# Patient Record
Sex: Male | Born: 1985
Health system: Southern US, Community
[De-identification: ages and names within clinical notes are randomized; demographics above are authoritative.]

## PROBLEM LIST (undated history)

## (undated) DIAGNOSIS — J45909 Unspecified asthma, uncomplicated: Secondary | ICD-10-CM

## (undated) DIAGNOSIS — T7840XA Allergy, unspecified, initial encounter: Secondary | ICD-10-CM

## (undated) DIAGNOSIS — E3452 Partial androgen insensitivity syndrome: Secondary | ICD-10-CM

## (undated) HISTORY — PX: OTHER SURGICAL HISTORY: SHX169

## (undated) HISTORY — DX: Partial androgen insensitivity syndrome: E34.52

## (undated) HISTORY — DX: Allergy, unspecified, initial encounter: T78.40XA

---
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
## 2008-08-31 ENCOUNTER — Emergency Department: Payer: Self-pay | Admitting: Internal Medicine

## 2014-07-08 ENCOUNTER — Emergency Department (HOSPITAL_COMMUNITY)
Admission: EM | Admit: 2014-07-08 | Discharge: 2014-07-08 | Disposition: A | Discharge status: Home / Self Care | Payer: PRIVATE HEALTH INSURANCE | Attending: Emergency Medicine | Admitting: Emergency Medicine

## 2014-07-08 ENCOUNTER — Encounter (HOSPITAL_COMMUNITY): Payer: Self-pay | Admitting: Emergency Medicine

## 2014-07-08 ENCOUNTER — Emergency Department (HOSPITAL_COMMUNITY): Payer: PRIVATE HEALTH INSURANCE

## 2014-07-08 DIAGNOSIS — S6992XA Unspecified injury of left wrist, hand and finger(s), initial encounter: Secondary | ICD-10-CM | POA: Diagnosis present

## 2014-07-08 DIAGNOSIS — Y281XXA Contact with knife, undetermined intent, initial encounter: Secondary | ICD-10-CM | POA: Insufficient documentation

## 2014-07-08 DIAGNOSIS — S61412A Laceration without foreign body of left hand, initial encounter: Secondary | ICD-10-CM | POA: Diagnosis not present

## 2014-07-08 DIAGNOSIS — Y990 Civilian activity done for income or pay: Secondary | ICD-10-CM | POA: Diagnosis not present

## 2014-07-08 DIAGNOSIS — IMO0002 Reserved for concepts with insufficient information to code with codable children: Secondary | ICD-10-CM

## 2014-07-08 DIAGNOSIS — Y9389 Activity, other specified: Secondary | ICD-10-CM | POA: Diagnosis not present

## 2014-07-08 DIAGNOSIS — J45909 Unspecified asthma, uncomplicated: Secondary | ICD-10-CM | POA: Insufficient documentation

## 2014-07-08 DIAGNOSIS — Y929 Unspecified place or not applicable: Secondary | ICD-10-CM | POA: Insufficient documentation

## 2014-07-08 HISTORY — DX: Unspecified asthma, uncomplicated: J45.909

## 2014-07-08 MED ORDER — LIDOCAINE HCL (PF) 1 % IJ SOLN
5.0000 mL | Freq: Once | INTRAMUSCULAR | Status: AC
Start: 2014-07-08 — End: 2014-07-08
  Administered 2014-07-08: 5 mL
  Filled 2014-07-08: qty 5

## 2014-07-08 NOTE — ED Provider Notes (Signed)
CSN: [id number]     Arrival date & time 07/08/14  [id number] History   First MD Initiated Contact with Patient 07/08/14 [phone]     Chief Complaint  Patient presents with  . Laceration     (Consider location/radiation/quality/duration/timing/severity/associated sxs/prior Treatment) HPI Comments: Joseph Mcguire is a 29 y.o. male with a PMHx of asthma, who presents to the ED with complaints of left hand laceration that occurred at 6:40 AM. He states he was working with a metal knife and accidentally punctured the dorsum of his left hand, creating a 1 cm straight laceration. He states he is having 3/10 constant stinging pain to this area, nonradiating, with no known aggravating or alleviating factors given the has not tried anything prior to arrival. Bleeding has been controlled with pressure. Last tetanus was 6 months ago. Denies any numbness, tingling, weakness, dizziness, lightheadedness, chest pain, shortness breath, abdominal pain, nausea, or vomiting. Denies any bleeding conditions or blood thinners. Denies any immunocompromising medications or conditions.  Patient is a 29 y.o. male presenting with skin laceration. The history is provided by the patient. No language interpreter was used.  Laceration Location:  Hand Hand laceration location:  Dorsum of L hand Length (cm):  1 Depth:  Cutaneous Quality: straight   Bleeding: controlled with pressure   Time since incident:  30 minutes Laceration mechanism:  Knife Pain details:    Quality: stinging.   Severity:  Mild   Timing:  Constant   Progression:  Unchanged Foreign body present:  No foreign bodies Relieved by:  None tried Worsened by:  Nothing tried Ineffective treatments:  None tried Tetanus status:  Up to date   Past Medical History  Diagnosis Date  . Asthma    History reviewed. No pertinent past surgical history. No family history on file. History  Substance Use Topics  . Smoking status: Current Every Day Smoker  .  Smokeless tobacco: Not on file  . Alcohol Use: Yes    Review of Systems  Respiratory: Negative for shortness of breath.   Cardiovascular: Negative for chest pain.  Gastrointestinal: Negative for nausea, vomiting and abdominal pain.  Musculoskeletal: Positive for myalgias (L hand). Negative for arthralgias.  Skin: Positive for wound.  Allergic/Immunologic: Negative for immunocompromised state.  Neurological: Negative for dizziness, syncope and light-headedness.  Hematological: Does not bruise/bleed easily.   10 Systems reviewed and are negative for acute change except as noted in the HPI.    Allergies  Review of patient's allergies indicates no known allergies.  Home Medications   Prior to Admission medications   Not on File   BP 128/73 mmHg  Pulse 62  Temp(Src) 98 F (36.7 C) (Oral)  Resp 14  Ht 6' (1.829 m)  Wt 190 lb (86.183 kg)  BMI 25.76 kg/m2  SpO2 99% Physical Exam  Constitutional: He is oriented to person, place, and time. Vital signs are normal. He appears well-developed and well-nourished.  Non-toxic appearance. No distress.  Afebrile, nontoxic, NAD  HENT:  Head: Normocephalic and atraumatic.  Mouth/Throat: Mucous membranes are normal.  Eyes: Conjunctivae and EOM are normal. Right eye exhibits no discharge. Left eye exhibits no discharge.  Neck: Normal range of motion. Neck supple.  Cardiovascular: Normal rate and intact distal pulses.   Pulmonary/Chest: Effort normal. No respiratory distress.  Abdominal: Normal appearance. He exhibits no distension.  Musculoskeletal: Normal range of motion.       Left hand: He exhibits laceration. He exhibits normal range of motion, no tenderness, no bony tenderness, normal  two-point discrimination, normal capillary refill and no swelling. Normal sensation noted. Normal strength noted.       Hands: L hand with ~1cm linear laceration to dorsum of hand, in the webspace between 1st and 2nd digits, extending into the musculature  without any tendon involvement. SEE PICTURE BELOW. Wiggles all digits, FROM intact at MCP/DIP/PIP joints of all digits, no bony TTP, no swelling or deformity, sensation and strength grossly intact. Cap refill brisk and present, distal pulses intact  Neurological: He is alert and oriented to person, place, and time. He has normal strength. No sensory deficit.  Skin: Skin is warm and dry. Laceration noted. No rash noted.  L hand laceration as described above. SEE PICTURE BELOW  Psychiatric: He has a normal mood and affect.  Nursing note and vitals reviewed.     ED Course  LACERATION REPAIR Date/Time: 07/08/2014 8:26 AM Performed by: Allen Derry Authorized by: Allen Derry Consent: Verbal consent obtained. Risks and benefits: risks, benefits and alternatives were discussed Consent given by: patient Patient understanding: patient states understanding of the procedure being performed Patient consent: the patient's understanding of the procedure matches consent given Patient identity confirmed: verbally with patient Body area: upper extremity Location details: left hand Laceration length: 1 cm Foreign bodies: no foreign bodies Tendon involvement: none Nerve involvement: none Vascular damage: no Anesthesia: local infiltration Local anesthetic: lidocaine 1% without epinephrine Anesthetic total: 1 ml Patient sedated: no Preparation: Patient was prepped and draped in the usual sterile fashion. Irrigation solution: saline Irrigation method: syringe Amount of cleaning: extensive Debridement: none Degree of undermining: none Skin closure: 4-0 Prolene Number of sutures: 1 Technique: simple Approximation: close Approximation difficulty: simple Dressing: 4x4 sterile gauze Patient tolerance: Patient tolerated the procedure well with no immediate complications   (including critical care time) Labs Review Labs Reviewed - No data to display  Imaging Review Dg  Hand Complete Left  07/08/2014   CLINICAL DATA:  Left hand laceration, evaluate for retained foreign body  EXAM: LEFT HAND - COMPLETE 3+ VIEW  COMPARISON:  None.  FINDINGS: Three views of the left hand submitted. No acute fracture or subluxation. No radiopaque foreign body is identified.  IMPRESSION: No acute fracture or subluxation. No radiopaque foreign body is identified.   Electronically Signed   By: Natasha Mead M.D.   On: 07/08/2014 07:57     EKG Interpretation None      MDM   Final diagnoses:  Laceration  Hand laceration, left, initial encounter    29 y.o. male here with small 1cm lac to dorsum of L hand, superficial but he states his knife punctured deeper therefore will obtain xray to r/o any retained FB. Neurovascularly intact with soft compartments, FROM intact at all digits. Will proceed with numbing area and probing, then suturing closed. Pt declines wanting pain meds at this time. Tetanus UTD. Will reassess shortly.   8:40 AM Xray negative. After numbing wound, probed, noted the depth to extend into musculature but did not involve any tendons, given that ROM intact, proceeded with copious irrigation and suture closure. Pt tolerating procedure well. Discussed ice/elevation/tylenol/motrin for pain. Doubt need for abx, discussed s/sx of infection to watch for as well as proper wound care, and will have him f/up with PCP or [facility] for suture removal in 7-10 days. I explained the diagnosis and have given explicit precautions to return to the ER including for any other new or worsening symptoms. The patient understands and accepts the medical plan as it's been dictated  and I have answered their questions. Discharge instructions concerning home care and prescriptions have been given. The patient is STABLE and is discharged to home in good condition.  BP 128/73 mmHg  Pulse 62  Temp(Src) 98 F (36.7 C) (Oral)  Resp 14  Ht 6' (1.829 m)  Wt 190 lb (86.183 kg)  BMI 25.76 kg/m2  SpO2  99%  Meds ordered this encounter  Medications  . lidocaine (PF) (XYLOCAINE) 1 % injection 5 mL    Sig:      Robertine Kipper Camprubi-Soms, PA-C 07/08/14 [id number]  Linwood DibblesJon Knapp, MD 07/08/14 [phone]

## 2014-07-08 NOTE — ED Notes (Signed)
Pt. presents with laceration approx. 1 inch at left posterior hand while at work this morning , pt. stated his knife accidentally slipped and hit his left hand. Bleeding controlled / dressing applied .

## 2014-07-08 NOTE — Discharge Instructions (Signed)
Keep wound clean with mild soap and water. Keep area covered with a topical antibiotic ointment and bandage, keep bandage dry, and do not submerge in water for 24 hours. Ice and elevate for additional pain relief and swelling. Alternate between Naprosyn and Tylenol for additional pain relief. Follow up with your primary care doctor or the [facility] in approximately 7-10 days for wound recheck and suture removal. Monitor area for signs of infection to include, but not limited to: increasing pain, redness, drainage/pus, or swelling. Return to emergency department for emergent changing or worsening symptoms.    Laceration Care, Adult A laceration is a cut that goes through all layers of the skin. The cut goes into the tissue beneath the skin. HOME CARE For stitches (sutures) or staples:  Keep the cut clean and dry.  If you have a bandage (dressing), change it at least once a day. Change the bandage if it gets wet or dirty, or as told by your doctor.  Wash the cut with soap and water 2 times a day. Rinse the cut with water. Pat it dry with a clean towel.  Put a thin layer of medicated cream on the cut as told by your doctor.  You may shower after the first 24 hours. Do not soak the cut in water until the stitches are removed.  Only take medicines as told by your doctor.  Have your stitches or staples removed as told by your doctor. For skin adhesive strips:  Keep the cut clean and dry.  Do not get the strips wet. You may take a bath, but be careful to keep the cut dry.  If the cut gets wet, pat it dry with a clean towel.  The strips will fall off on their own. Do not remove the strips that are still stuck to the cut. For wound glue:  You may shower or take baths. Do not soak or scrub the cut. Do not swim. Avoid heavy sweating until the glue falls off on its own. After a shower or bath, pat the cut dry with a clean towel.  Do not put medicine on your cut until the  glue falls off.  If you have a bandage, do not put tape over the glue.  Avoid lots of sunlight or tanning lamps until the glue falls off. Put sunscreen on the cut for the first year to reduce your scar.  The glue will fall off on its own. Do not pick at the glue. You may need a tetanus shot if:  You cannot remember when you had your last tetanus shot.  You have never had a tetanus shot. If you need a tetanus shot and you choose not to have one, you may get tetanus. Sickness from tetanus can be serious. GET HELP RIGHT AWAY IF:   Your pain does not get better with medicine.  Your arm, hand, leg, or foot loses feeling (numbness) or changes color.  Your cut is bleeding.  Your joint feels weak, or you cannot use your joint.  You have painful lumps on your body.  Your cut is red, puffy (swollen), or painful.  You have a red line on the skin near the cut.  You have yellowish-white fluid (pus) coming from the cut.  You have a fever.  You have a bad smell coming from the cut or bandage.  Your cut breaks open before or after stitches are removed.  You notice something coming out of the cut, such  as wood or glass.  You cannot move a finger or toe. MAKE SURE YOU:   Understand these instructions.  Will watch your condition.  Will get help right away if you are not doing well or get worse. Document Released: 07/27/2007 Document Revised: 05/02/2011 Document Reviewed: 08/03/2010 [facility] Patient Information 2015 [place], [place]. This information is not intended to replace advice given to you by your health care provider. Make sure you discuss any questions you have with your health care provider.  Stitches, Staples, or Skin Adhesive Strips  Stitches (sutures), staples, and skin adhesive strips hold the skin together as it heals. They will usually be in place for 7 days or less. HOME CARE  Wash your hands with soap and water before and after you touch your wound.  Only take  medicine as told by your doctor.  Cover your wound only if your doctor told you to. Otherwise, leave it open to air.  Do not get your stitches wet or dirty. If they get dirty, dab them gently with a clean washcloth. Wet the washcloth with soapy water. Do not rub. Pat them dry gently.  Do not put medicine or medicated cream on your stitches unless your doctor told you to.  Do not take out your own stitches or staples. Skin adhesive strips will fall off by themselves.  Do not pick at the wound. Picking can cause an infection.  Do not miss your follow-up appointment.  If you have problems or questions, call your doctor. GET HELP RIGHT AWAY IF:   You have a temperature by mouth above 102 F (38.9 C), not controlled by medicine.  You have chills.  You have redness or pain around your stitches.  There is puffiness (swelling) around your stitches.  You notice fluid (drainage) from your stitches.  There is a bad smell coming from your wound. MAKE SURE YOU:  Understand these instructions.  Will watch your condition.  Will get help if you are not doing well or get worse. Document Released: 12/05/2008 Document Revised: 05/02/2011 Document Reviewed: 12/05/2008 [facility] Patient Information 2015 [place], [place]. This information is not intended to replace advice given to you by your health care provider. Make sure you discuss any questions you have with your health care provider.  Wound Care Wound care helps prevent pain and infection.  You may need a tetanus shot if:  You cannot remember when you had your last tetanus shot.  You have never had a tetanus shot.  The injury broke your skin. If you need a tetanus shot and you choose not to have one, you may get tetanus. Sickness from tetanus can be serious. HOME CARE   Only take medicine as told by your doctor.  Clean the wound daily with mild soap and water.  Change any bandages (dressings) as told by your doctor.  Put  medicated cream and a bandage on the wound as told by your doctor.  Change the bandage if it gets wet, dirty, or starts to smell.  Take showers. Do not take baths, swim, or do anything that puts your wound under water.  Rest and raise (elevate) the wound until the pain and puffiness (swelling) are better.  Keep all doctor visits as told. GET HELP RIGHT AWAY IF:   Yellowish-white fluid (pus) comes from the wound.  Medicine does not lessen your pain.  There is a red streak going away from the wound.  You have a fever. MAKE SURE YOU:   Understand these instructions.  Will watch your condition.  Will get help right away if you are not doing well or get worse. Document Released: 11/17/2007 Document Revised: 05/02/2011 Document Reviewed: 06/13/2010 [facility] Patient Information 2015 [place]Care, [place]. This information is not intended to replace advice given to you by your health care provider. Make sure you discuss any questions you have with your health care provider.

## 2015-09-17 DIAGNOSIS — R002 Palpitations: Secondary | ICD-10-CM | POA: Diagnosis not present

## 2015-12-15 ENCOUNTER — Encounter (HOSPITAL_COMMUNITY): Payer: Self-pay | Admitting: Emergency Medicine

## 2015-12-15 ENCOUNTER — Ambulatory Visit (HOSPITAL_COMMUNITY)
Admission: EM | Admit: 2015-12-15 | Discharge: 2015-12-15 | Disposition: A | Discharge status: Home / Self Care | Payer: 59 | Attending: Family Medicine | Admitting: Family Medicine

## 2015-12-15 DIAGNOSIS — J029 Acute pharyngitis, unspecified: Secondary | ICD-10-CM | POA: Insufficient documentation

## 2015-12-15 LAB — POCT RAPID STREP A: STREPTOCOCCUS, GROUP A SCREEN (DIRECT): NEGATIVE

## 2015-12-15 NOTE — ED Triage Notes (Signed)
Chills, fever, hot and cold spells, sore throat.  Wife was recently sick and treated for strep.

## 2015-12-15 NOTE — ED Provider Notes (Signed)
CSN: [id number]     Arrival date & time 12/15/15  1544 History   First MD Initiated Contact with Patient 12/15/15 1624     Chief Complaint  Patient presents with  . Sore Throat   (Consider location/radiation/quality/duration/timing/severity/associated sxs/prior Treatment) 30 year old male states that approximately 48 hours ago he developed sore throat. This was followed by some achiness and fever. Earlier today his temperature was 100.3. Is currently 98.2 after he had taken ibuprofen. He thought he had seen some white spots on his left tonsil. He feels there is some discomfort to the left ear otherwise no new complaints. He has a history of allergies and generally takes medicines for it. St wife was given ABX for sore throat over 3 weeks ago.      Past Medical History:  Diagnosis Date  . Asthma    History reviewed. No pertinent surgical history. History reviewed. No pertinent family history. Social History  Substance Use Topics  . Smoking status: Former Games developer  . Smokeless tobacco: Never Used  . Alcohol use Yes    Review of Systems  Constitutional: Positive for activity change and fever. Negative for diaphoresis and fatigue.  HENT: Positive for postnasal drip and sore throat. Negative for ear pain, facial swelling and rhinorrhea.   Eyes: Negative for pain, discharge and redness.  Respiratory: Negative for cough, chest tightness and shortness of breath.   Cardiovascular: Negative.   Gastrointestinal: Negative.   Musculoskeletal: Negative.  Negative for neck pain and neck stiffness.  Skin: Negative for rash.  Neurological: Negative.   All other systems reviewed and are negative.   Allergies  Review of patient's allergies indicates no known allergies.  Home Medications   Prior to Admission medications   Medication Sig Start Date End Date Taking? Authorizing Provider  loratadine (CLARITIN) 10 MG tablet Take 10 mg by mouth daily.   Yes Historical Provider, MD   Meds  Ordered and Administered this Visit  Medications - No data to display  BP 129/71 (BP Location: Right Arm)   Pulse 93   Temp 98.2 F (36.8 C) (Oral)   Resp 18   SpO2 100%  No data found.   Physical Exam  Constitutional: He is oriented to person, place, and time. He appears well-developed and well-nourished. No distress.  HENT:  Head: Normocephalic and atraumatic.  Mouth/Throat: No oropharyngeal exudate.  Bilateral TMs with minor retraction. No effusion or erythema. Oropharynx with mildly erythematous small palatine tonsils palatine arch and posterior pharynx with erythema and cobblestoning.  Eyes: EOM are normal.  Neck: Normal range of motion. Neck supple.  Too small tender nodes, 1 each to the left and right side of the anterior cervical chain.  Cardiovascular: Normal rate and regular rhythm.   Pulmonary/Chest: Effort normal and breath sounds normal.  Musculoskeletal: Normal range of motion. He exhibits no edema.  Neurological: He is alert and oriented to person, place, and time.  Skin: Skin is warm and dry.  Psychiatric: He has a normal mood and affect.  Nursing note and vitals reviewed.   Urgent Care Course   Clinical Course    Procedures (including critical care time)  Labs Review Labs Reviewed  POCT RAPID STREP A   Results for orders placed or performed during the hospital encounter of 12/15/15  POCT rapid strep A [facility])  Result Value Ref Range   Streptococcus, Group A Screen (Direct) NEGATIVE NEGATIVE     Imaging Review No results found.   Visual Acuity Review  Right Eye  Distance:   Left Eye Distance:   Bilateral Distance:    Right Eye Near:   Left Eye Near:    Bilateral Near:         MDM   1. Pharyngitis, unspecified etiology    The strep test was negative. A culture will be performed as a backup test to make sure it is or is not streptococcus A. If it does come back positive we will call you and will be able to treat you over the  telephone. In the meantime treat symptomatically with lots of cool liquids and ibuprofen 600 mg every 6 hours. This will last just a few days. You also had the appearance of moderate amount of clear drainage in the back of the throat. Take an antihistamine daily, a continue the Claritin or take something a little stronger such as Allegra 180 mg for the drainage. T/C pending.    Hayden Rasmussenavid Carrick Rijos, NP 12/15/15 [phone]

## 2015-12-15 NOTE — Discharge Instructions (Signed)
The strep test was negative. A culture will be performed as a backup test to make sure it is or is not streptococcus A. If it does come back positive we will call you and will be able to treat you over the telephone. In the meantime treat symptomatically with lots of cool liquids and ibuprofen 600 mg every 6 hours. This will last just a few days. You also had the appearance of moderate amount of clear drainage in the back of the throat. Take an antihistamine daily, a continue the Claritin or take something a little stronger such as Allegra 180 mg for the drainage.

## 2015-12-18 LAB — CULTURE, GROUP A STREP (THRC)

## 2016-01-20 IMAGING — CR DG HAND COMPLETE 3+V*L*
3 series · 3 of 3 positions shown · non-contrast
Comparison: None.

CLINICAL DATA: Left hand laceration, evaluate for retained foreign
body

EXAM:
LEFT HAND - COMPLETE 3+ VIEW

[x hand pa left]
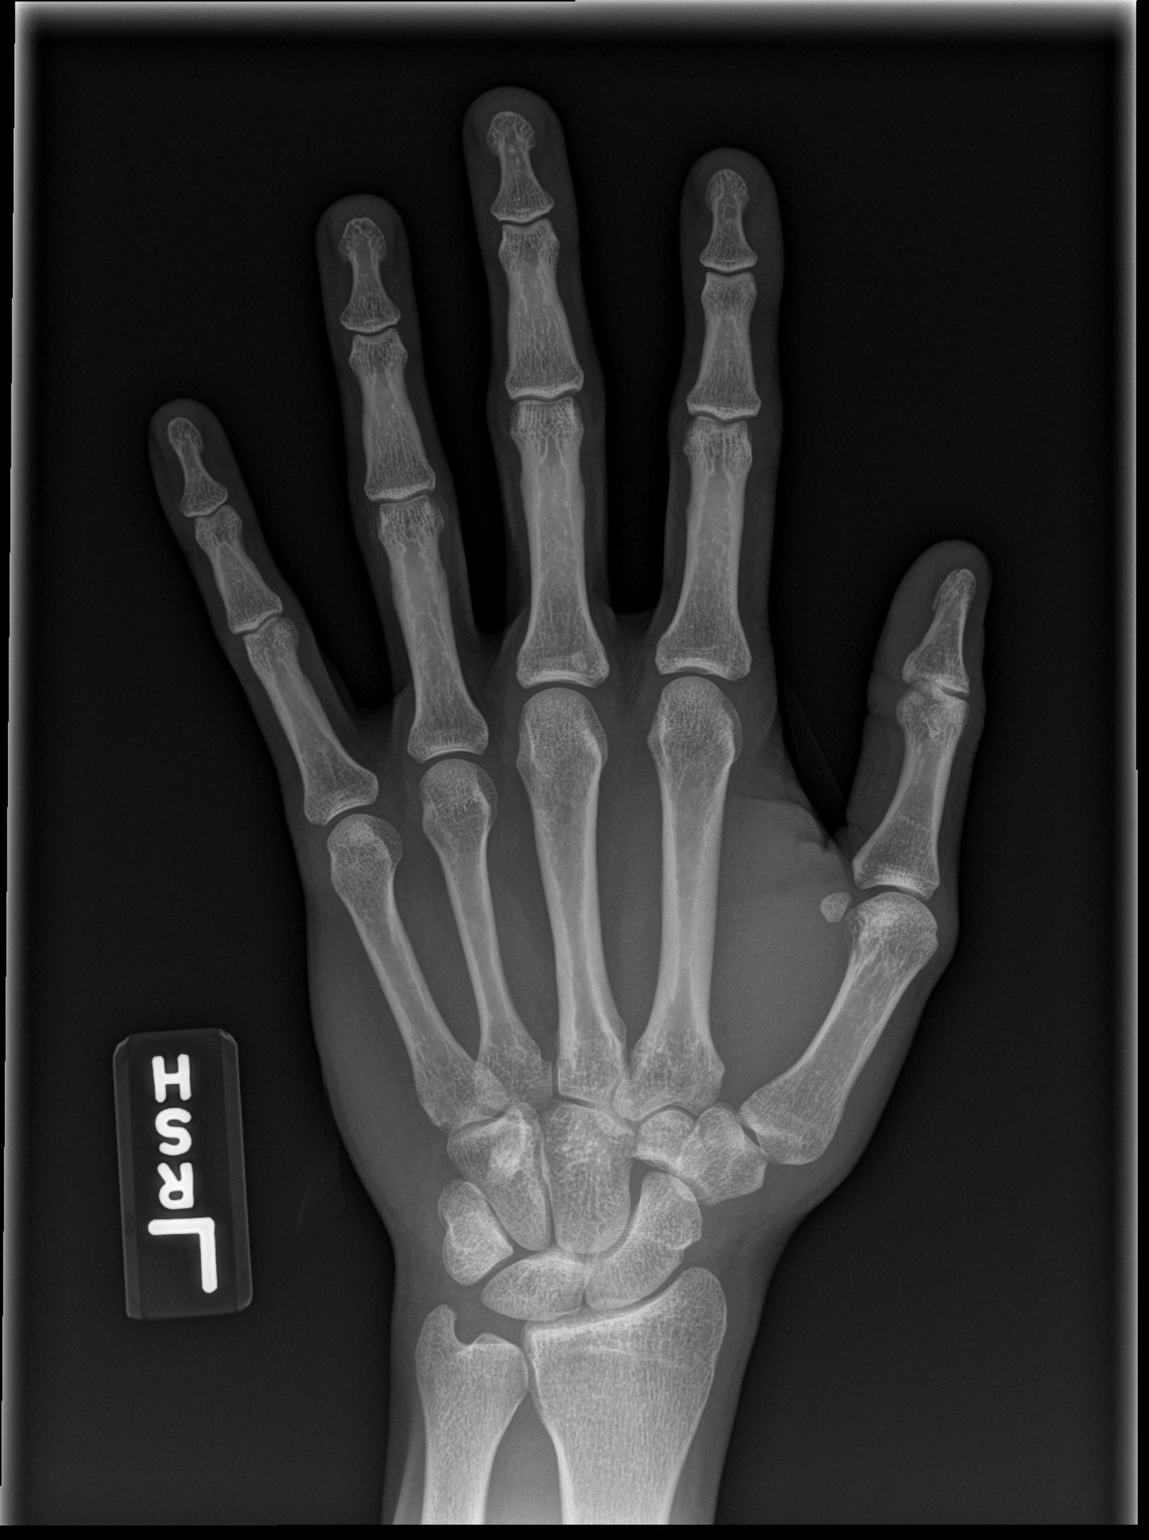

[x hand obl left]
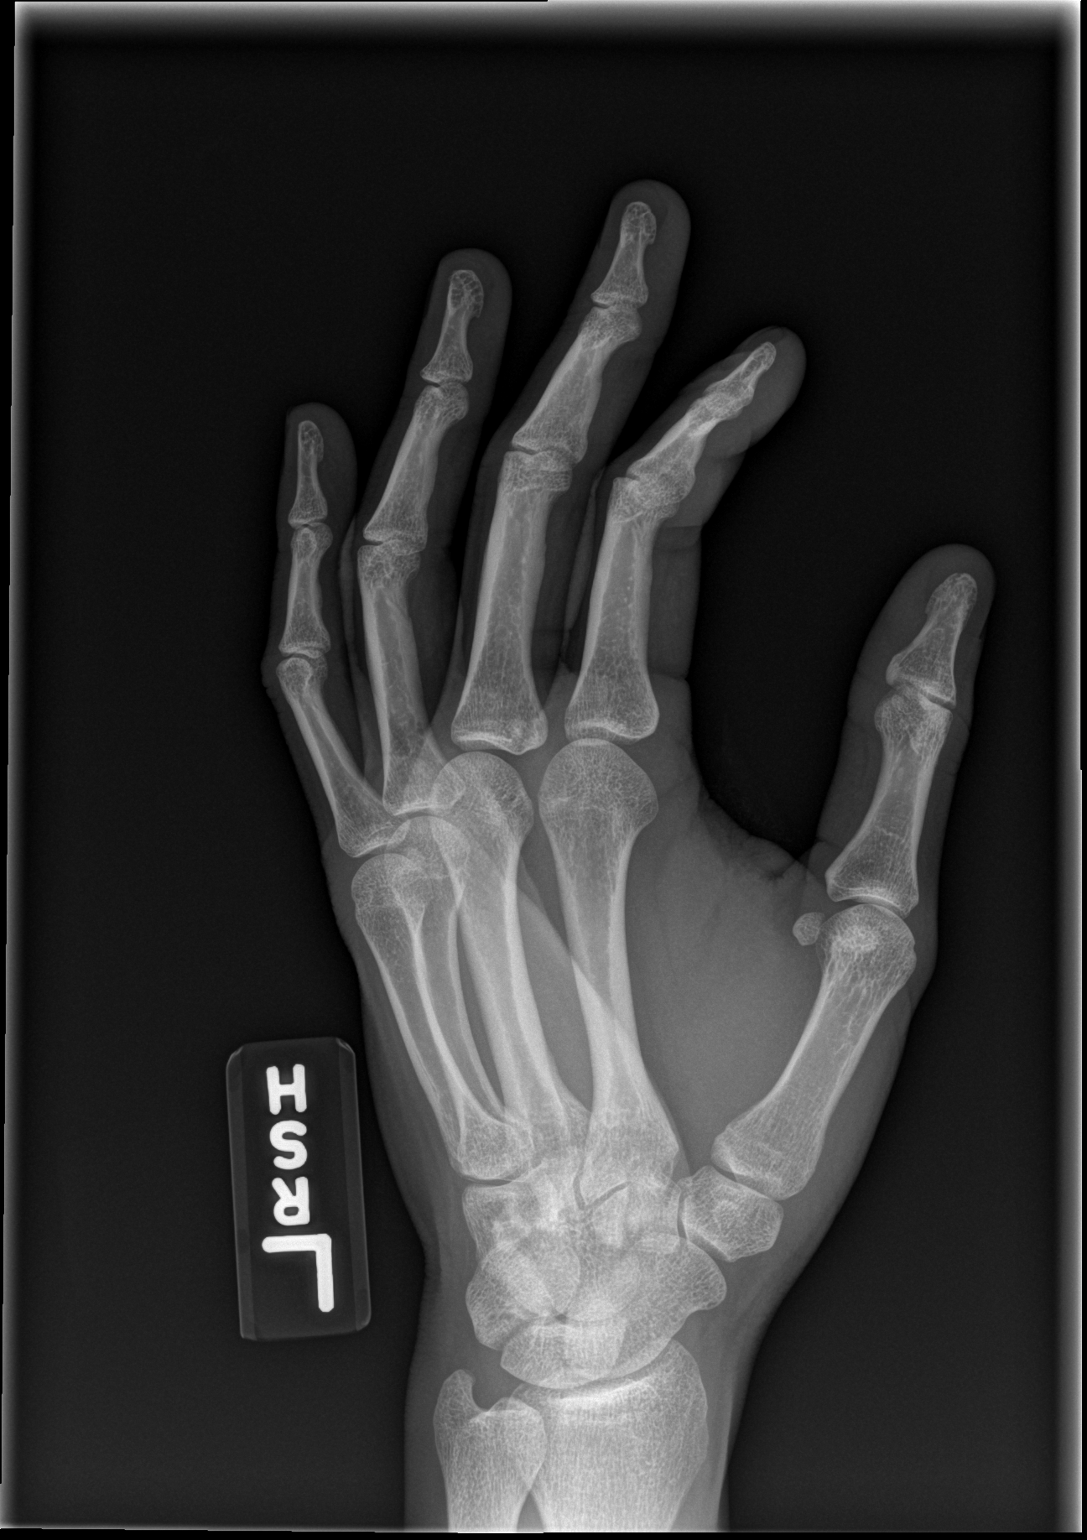

[x hand lat left]
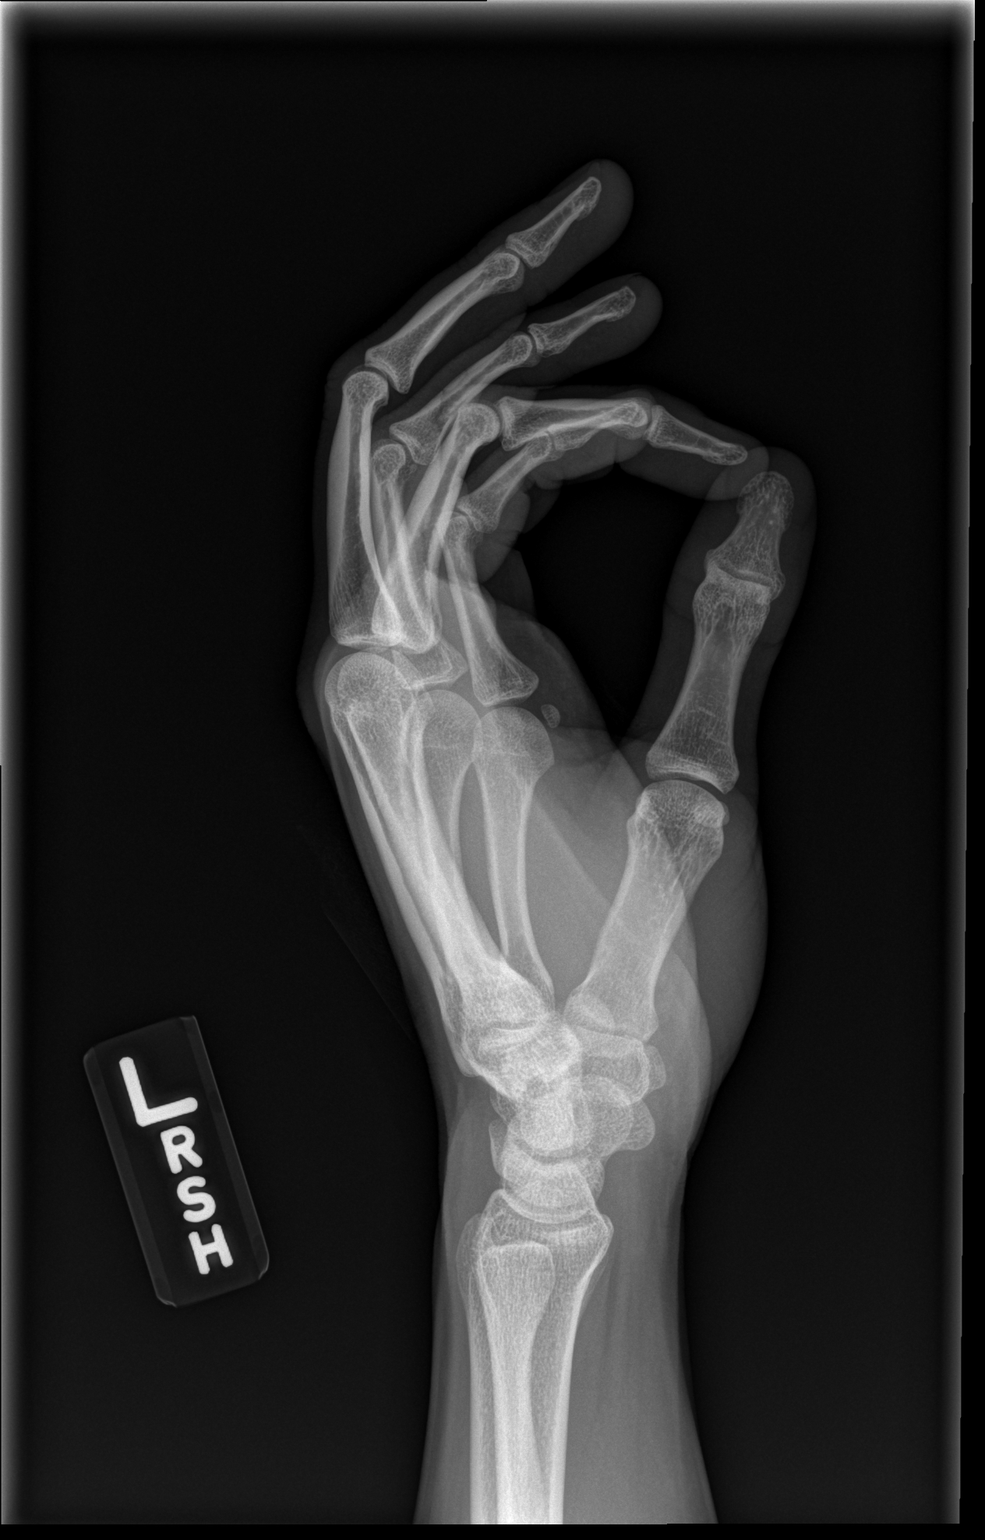

[3 of 3 positions shown; findings below may reference images not displayed]

FINDINGS: Three views of the left hand submitted. No acute fracture or
subluxation. No radiopaque foreign body is identified.
IMPRESSION: No acute fracture or subluxation. No radiopaque foreign body is
identified.

## 2016-06-22 ENCOUNTER — Ambulatory Visit: Payer: 59 | Admitting: Family Medicine

## 2016-08-16 ENCOUNTER — Ambulatory Visit (INDEPENDENT_AMBULATORY_CARE_PROVIDER_SITE_OTHER): Payer: 59 | Admitting: Family Medicine

## 2016-08-16 ENCOUNTER — Encounter: Payer: Self-pay | Admitting: Family Medicine

## 2016-08-16 VITALS — BP 116/60 | HR 76 | Temp 98.2°F | Resp 14 | Ht 72.0 in | Wt 203.0 lb

## 2016-08-16 DIAGNOSIS — N469 Male infertility, unspecified: Secondary | ICD-10-CM

## 2016-08-16 DIAGNOSIS — Z0000 Encounter for general adult medical examination without abnormal findings: Secondary | ICD-10-CM

## 2016-08-16 LAB — CBC WITH DIFFERENTIAL/PLATELET
Basophils Absolute: 73 cells/uL (ref 0–200)
Basophils Relative: 1 %
Eosinophils Absolute: 146 cells/uL (ref 15–500)
Eosinophils Relative: 2 %
HCT: 43 % (ref 38.5–50.0)
Hemoglobin: 14.6 g/dL (ref 13.0–17.0)
Lymphocytes Relative: 38 %
Lymphs Abs: 2774 cells/uL (ref 850–3900)
MCH: 29.6 pg (ref 27.0–33.0)
MCHC: 34 g/dL (ref 32.0–36.0)
MCV: 87.2 fL (ref 80.0–100.0)
MONOS PCT: 8 %
MPV: 11.4 fL (ref 7.5–12.5)
Monocytes Absolute: 584 cells/uL (ref 200–950)
Neutro Abs: 3723 cells/uL (ref 1500–7800)
Neutrophils Relative %: 51 %
PLATELETS: 200 10*3/uL (ref 140–400)
RBC: 4.93 MIL/uL (ref 4.20–5.80)
RDW: 13.2 % (ref 11.0–15.0)
WBC: 7.3 10*3/uL (ref 3.8–10.8)

## 2016-08-16 LAB — TSH: TSH: 1.28 m[IU]/L (ref 0.40–4.50)

## 2016-08-16 NOTE — Progress Notes (Signed)
   Subjective:    Patient ID: Joseph Mcguire, male    DOB: 02/01/1986, 31 y.o.   MRN: [id number]  Patient presents for New Patient CPE (is not fasting)  Patient here to establish care. 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 He did have semen frozen for later use with is wife. He was offered testosterone replacement in the past but declined. Now finds himself having mood swings ( gets anxious very quickly, startles easily), decreased libido would like to have this re-evaluated. He also gets fatigue but not sure if this due to working third shift and not sleeping as well.   Asthma- childhood, no problems since teenage years. Non smoker  Works out regular basis, lifts weights, trying to bulk up, but not putting on muscle     Review Of Systems:  GEN- denies fatigue, fever, weight loss,weakness, recent illness HEENT- denies eye drainage, change in vision, nasal discharge, CVS- denies chest pain, palpitations RESP- denies SOB, cough, wheeze ABD- denies N/V, change in stools, abd pain GU- denies dysuria, hematuria, dribbling, incontinence MSK- denies joint pain, muscle aches, injury Neuro- denies headache, dizziness, syncope, seizure activity       Objective:    BP 116/60   Pulse 76   Temp 98.2 F (36.8 C) (Oral)   Resp 14   Ht 6' (1.829 m)   Wt 203 lb (92.1 kg)   SpO2 99%   BMI 27.53 kg/m  GEN- NAD, alert and oriented x3 HEENT- PERRL, EOMI, non injected sclera, pink conjunctiva, MMM, oropharynx clear Neck- Supple, no thyromegaly CVS- RRR, no murmur RESP-CTAB ABD-NABS,soft,NT,ND Psych- normal affect and mood  EXT- No edema Pulses- Radial, DP- 2+        Assessment & Plan:      Problem List Items Addressed This Visit    None    Visit Diagnoses    Routine general medical examination at a health care facility    -  Primary   CPE done, check fasting labs and testosterone levels, TSH based on history will need hormone replacement at some point. Will see if labs correspond with his mood/libido changes. Discussed treating this may help but may also differentiate from underlying mood disorder if other symptoms improve except for mood, which he describes as more anxiety symptoms.   Relevant Orders   CBC with Differential/Platelet (Completed)   Comprehensive metabolic panel (Completed)   Lipid panel (Completed)   TSH (Completed)   Male infertility       Relevant Orders   Testosterone (Completed)      Note: This dictation was prepared with Dragon dictation along with smaller phrase technology. Any transcriptional errors that result from this process are unintentional.

## 2016-08-16 NOTE — Patient Instructions (Signed)
F/U pending results   

## 2016-08-17 LAB — LIPID PANEL
Cholesterol: 165 mg/dL (ref <200)
HDL: 54 mg/dL (ref >40)
LDL Cholesterol: 100 mg/dL — ABNORMAL HIGH (ref <100)
Total CHOL/HDL Ratio: 3.1 Ratio (ref <5.0)
Triglycerides: 56 mg/dL (ref <150)
VLDL: 11 mg/dL (ref <30)

## 2016-08-17 LAB — TESTOSTERONE: TESTOSTERONE: 427 ng/dL (ref 250–827)

## 2016-08-17 LAB — COMPREHENSIVE METABOLIC PANEL
ALT: 13 U/L (ref 9–46)
AST: 13 U/L (ref 10–40)
Albumin: 4.8 g/dL (ref 3.6–5.1)
Alkaline Phosphatase: 58 U/L (ref 40–115)
BUN: 15 mg/dL (ref 7–25)
CHLORIDE: 103 mmol/L (ref 98–110)
CO2: 23 mmol/L (ref 20–31)
Calcium: 9.7 mg/dL (ref 8.6–10.3)
Creat: 1.04 mg/dL (ref 0.60–1.35)
GLUCOSE: 84 mg/dL (ref 70–99)
Potassium: 3.9 mmol/L (ref 3.5–5.3)
Sodium: 139 mmol/L (ref 135–146)
Total Bilirubin: 0.6 mg/dL (ref 0.2–1.2)
Total Protein: 7.4 g/dL (ref 6.1–8.1)

## 2016-08-22 ENCOUNTER — Encounter: Payer: Self-pay | Admitting: Family Medicine

## 2016-11-08 ENCOUNTER — Ambulatory Visit: Payer: Self-pay | Admitting: Family Medicine

## 2016-11-08 ENCOUNTER — Encounter: Payer: Self-pay | Admitting: Family Medicine

## 2016-11-16 ENCOUNTER — Encounter: Payer: Self-pay | Admitting: Family Medicine

## 2018-10-10 MED FILL — CLOMIPHENE CITRATE 50 MG TA: 50 | 60 days supply | Qty: 30 | Fill #0

## 2018-10-10 MED FILL — traMADol HCL 50 MG TABS: 50 | 2 days supply | Qty: 10 | Fill #0

## 2018-11-27 MED FILL — CLOMIPHENE CITRATE 50 MG TA: 50 | 60 days supply | Qty: 30 | Fill #1

## 2018-12-05 MED FILL — traMADol HCL 50 MG TABS: 50 | 2 days supply | Qty: 8 | Fill #0

## 2018-12-05 MED FILL — PROMETHAZINE 12.5 MG TABLET: 12.5 | 3 days supply | Qty: 15 | Fill #0

## 2019-03-04 MED FILL — CLOMIPHENE CITRATE 50 MG TA: 50 | 60 days supply | Qty: 30 | Fill #2

## 2019-11-25 ENCOUNTER — Other Ambulatory Visit: Payer: Self-pay

## 2019-11-25 ENCOUNTER — Encounter: Payer: Self-pay | Admitting: Family Medicine

## 2019-11-25 ENCOUNTER — Ambulatory Visit (INDEPENDENT_AMBULATORY_CARE_PROVIDER_SITE_OTHER): Payer: No Typology Code available for payment source | Admitting: Family Medicine

## 2019-11-25 VITALS — BP 126/80 | HR 84 | Temp 98.1°F | Resp 16 | Ht 72.0 in | Wt 244.0 lb

## 2019-11-25 DIAGNOSIS — E66811 Obesity, class 1: Secondary | ICD-10-CM

## 2019-11-25 DIAGNOSIS — Z1159 Encounter for screening for other viral diseases: Secondary | ICD-10-CM

## 2019-11-25 DIAGNOSIS — Z0001 Encounter for general adult medical examination with abnormal findings: Secondary | ICD-10-CM | POA: Diagnosis not present

## 2019-11-25 DIAGNOSIS — Z0000 Encounter for general adult medical examination without abnormal findings: Secondary | ICD-10-CM

## 2019-11-25 DIAGNOSIS — E669 Obesity, unspecified: Secondary | ICD-10-CM | POA: Diagnosis not present

## 2019-11-25 DIAGNOSIS — Z23 Encounter for immunization: Secondary | ICD-10-CM

## 2019-11-25 DIAGNOSIS — Z114 Encounter for screening for human immunodeficiency virus [HIV]: Secondary | ICD-10-CM

## 2019-11-25 NOTE — Patient Instructions (Addendum)
F/U 1 year for Physical  

## 2019-11-25 NOTE — Progress Notes (Signed)
Subjective:    Patient ID: Joseph Mcguire, male    DOB: [birth year], 34 y.o.   MRN: [id number]  Patient presents for Annual Exam (is fasting)    Pt here for CPE, last visit in 2018 when he establish care   June 2018  Visit  Patient here to establish care. 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 He did have semen frozen for later use with is wife. He was offered testosterone replacement in the past but declined. Now finds himself having mood swings ( gets anxious very quickly, startles easily), decreased libido would like to have this re-evaluated. He also gets fatigue but not sure if this due to working third shift and not sleeping as well.   Asthma- childhood, no problems since teenage years. Non smoker  Works out regular basis, [employer],   Today, pt here for CPE   No current meds    History reviewed   Immunizations reviewed due for  Flu shot , TDAP UTD   He is expecting a child in December      He has not been working out in setting of pandemic, previously avid Engineer, petroleum member    Wears glasses- recent visit to [facility]  Dentist every 6 months   Review Of Systems:  GEN- denies fatigue, fever, weight loss,weakness, recent illness HEENT- denies eye drainage, change in vision, nasal discharge, CVS- denies chest pain, palpitations RESP- denies SOB, cough, wheeze ABD- denies N/V, change in stools, abd pain GU- denies dysuria, hematuria, dribbling, incontinence MSK- denies joint pain, muscle aches, injury Neuro- denies headache, dizziness, syncope, seizure activity       Objective:    BP 126/80   Pulse 84   Temp 98.1 F (36.7 C) (Temporal)   Resp 16   Ht 6' (1.829 m)   Wt 244 lb (110.7 kg)   SpO2 98%   BMI 33.09 kg/m  GEN- NAD, alert and oriented x3 HEENT- PERRL, EOMI, non  injected sclera, pink conjunctiva, MMM, oropharynx clear, TM clear no effusion, nares clear  Neck- Supple, no thyromegaly CVS- RRR, no murmur RESP-CTAB ABD-NABS,soft,NT,ND Psych normal affect and mood  EXT- No edema Pulses- Radial, DP- 2+  FALL/DEPRESSION/AUDIT C negative       Assessment & Plan:      Problem List Items Addressed This Visit      Unprioritized   Class 1 obesity   Relevant Orders   Comprehensive metabolic panel   Lipid panel    Other Visit Diagnoses    Routine general medical examination at a health care facility    -  Primary   CPE done, flu shot given, fasting labs, discussed return to exercise, screening for Hep C/HIV done and lipids   Relevant Orders   CBC with Differential/Platelet   Comprehensive metabolic panel   Lipid panel   Need for immunization against influenza       Relevant Orders   Flu Vaccine QUAD 36+ mos IM (Completed)   Need for hepatitis C screening test       Relevant Orders   Hepatitis C antibody   Encounter for screening for HIV       Relevant Orders   HIV Antibody (routine testing w rflx)      Note: This dictation was prepared with Dragon dictation  along with smaller phrase technology. Any transcriptional errors that result from this process are unintentional.

## 2019-11-26 LAB — COMPREHENSIVE METABOLIC PANEL
AG Ratio: 1.7 (calc) (ref 1.0–2.5)
ALT: 20 U/L (ref 9–46)
AST: 15 U/L (ref 10–40)
Albumin: 4.5 g/dL (ref 3.6–5.1)
Alkaline phosphatase (APISO): 53 U/L (ref 36–130)
BUN: 9 mg/dL (ref 7–25)
CO2: 25 mmol/L (ref 20–32)
Calcium: 9.8 mg/dL (ref 8.6–10.3)
Chloride: 105 mmol/L (ref 98–110)
Creat: 0.86 mg/dL (ref 0.60–1.35)
Globulin: 2.7 g/dL (calc) (ref 1.9–3.7)
Glucose, Bld: 89 mg/dL (ref 65–99)
Potassium: 4.3 mmol/L (ref 3.5–5.3)
Sodium: 140 mmol/L (ref 135–146)
Total Bilirubin: 0.5 mg/dL (ref 0.2–1.2)
Total Protein: 7.2 g/dL (ref 6.1–8.1)

## 2019-11-26 LAB — CBC WITH DIFFERENTIAL/PLATELET
Absolute Monocytes: 602 cells/uL (ref 200–950)
Basophils Absolute: 119 cells/uL (ref 0–200)
Basophils Relative: 1.7 %
Eosinophils Absolute: 322 cells/uL (ref 15–500)
Eosinophils Relative: 4.6 %
HCT: 44.7 % (ref 38.5–50.0)
Hemoglobin: 14.5 g/dL (ref 13.2–17.1)
Lymphs Abs: 2044 cells/uL (ref 850–3900)
MCH: 28.8 pg (ref 27.0–33.0)
MCHC: 32.4 g/dL (ref 32.0–36.0)
MCV: 88.7 fL (ref 80.0–100.0)
MPV: 12 fL (ref 7.5–12.5)
Monocytes Relative: 8.6 %
Neutro Abs: 3913 cells/uL (ref 1500–7800)
Neutrophils Relative %: 55.9 %
Platelets: 206 10*3/uL (ref 140–400)
RBC: 5.04 10*6/uL (ref 4.20–5.80)
RDW: 12.3 % (ref 11.0–15.0)
Total Lymphocyte: 29.2 %
WBC: 7 10*3/uL (ref 3.8–10.8)

## 2019-11-26 LAB — LIPID PANEL
Cholesterol: 199 mg/dL (ref <200)
HDL: 58 mg/dL (ref >=40)
LDL Cholesterol (Calc): 117 mg/dL (calc) — ABNORMAL HIGH
Non-HDL Cholesterol (Calc): 141 mg/dL (calc) — ABNORMAL HIGH (ref <130)
Total CHOL/HDL Ratio: 3.4 (calc) (ref <5.0)
Triglycerides: 125 mg/dL (ref <150)

## 2019-11-26 LAB — HEPATITIS C ANTIBODY
Hepatitis C Ab: NONREACTIVE
SIGNAL TO CUT-OFF: 0.01 (ref <1.00)

## 2019-11-26 LAB — HIV ANTIBODY (ROUTINE TESTING W REFLEX): HIV 1&2 Ab, 4th Generation: NONREACTIVE

## 2019-11-27 ENCOUNTER — Encounter: Payer: Self-pay | Admitting: *Deleted

## 2020-06-22 ENCOUNTER — Encounter (HOSPITAL_COMMUNITY): Payer: Self-pay

## 2020-06-22 ENCOUNTER — Ambulatory Visit (HOSPITAL_COMMUNITY)
Admission: EM | Admit: 2020-06-22 | Discharge: 2020-06-22 | Disposition: A | Discharge status: Home / Self Care | Payer: Worker's Compensation | Attending: Physician Assistant | Admitting: Physician Assistant

## 2020-06-22 DIAGNOSIS — S61212A Laceration without foreign body of right middle finger without damage to nail, initial encounter: Secondary | ICD-10-CM | POA: Diagnosis not present

## 2020-06-22 DIAGNOSIS — S6991XA Unspecified injury of right wrist, hand and finger(s), initial encounter: Secondary | ICD-10-CM

## 2020-06-22 MED ORDER — TETANUS-DIPHTH-ACELL PERTUSSIS 5-2.5-18.5 LF-MCG/0.5 IM SUSY
PREFILLED_SYRINGE | INTRAMUSCULAR | Status: AC
  Filled 2020-06-22: qty 0.5

## 2020-06-22 MED ORDER — TETANUS-DIPHTH-ACELL PERTUSSIS 5-2.5-18.5 LF-MCG/0.5 IM SUSY
0.5000 mL | PREFILLED_SYRINGE | Freq: Once | INTRAMUSCULAR | Status: AC
  Administered 2020-06-22: 0.5 mL via INTRAMUSCULAR

## 2020-06-22 NOTE — Discharge Instructions (Signed)
Do not get area wet for 24 hours.  We updated your tetanus today.  He can take Tylenol and ibuprofen for pain relief.  Keep in splint to allow for healing.  If you have any signs of infection (redness, pain, discharge) you need to be reevaluated.  Please return in 7 to 10 days for suture removal.

## 2020-06-22 NOTE — ED Provider Notes (Signed)
[facility]    CSN: [id number] Arrival date & time: 06/22/20  1655      History   Chief Complaint Chief Complaint  Patient presents with  . Laceration    HPI Joseph Mcguire is a 35 y.o. male.   Patient presents today with a several hour history of laceration to right middle finger.  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  He has no concern for fracture and declined x-ray today.  He reports normal active range of motion and denies any numbness or paresthesias.  He believes his last tetanus was approximately 7 years ago.  He is open to updating this today.  He reports pain is rated 2 on a 0-10 pain scale, localized to affected area without radiation, described as aching, no aggravating relieving factors identified.  He has cleaned affected area with warm water and soap and dressed this with normal bandage.  He is right-handed.     Past Medical History:  Diagnosis Date  . Allergy    seasonal  . Asthma    childhood  . Infertile male syndrome     Patient Active Problem List   Diagnosis Date Noted  . Class 1 obesity 11/25/2019    Past Surgical History:  Procedure Laterality Date  . testicular graft         Home Medications    Prior to Admission medications   Medication Sig Start Date End Date Taking? Authorizing Provider  loratadine (CLARITIN) 10 MG tablet Take 10 mg by mouth daily.    [provider]  Multiple Vitamin (MULTIVITAMIN WITH MINERALS) TABS tablet Take 1 tablet by mouth daily.    [provider]    Family History Family History  Problem Relation Age of Onset  . Depression Mother   . Arthritis Father   . Hyperlipidemia Father   . Hypertension Father   . Birth defects Brother   . Early death Brother 25       aspiration  . Mental retardation Brother   . Learning disabilities Brother   . Stroke Paternal Uncle   . Arthritis Paternal Grandmother   . Cancer Paternal Grandfather         lung, bone  . COPD Paternal Grandfather     Social History Social History   Tobacco Use  . Smoking status: Never Smoker  . Smokeless tobacco: Never Used  Vaping Use  . Vaping Use: Never used  Substance Use Topics  . Alcohol use: Yes    Comment: social  . Drug use: No     Allergies   Patient has no known allergies.   Review of Systems Review of Systems  Constitutional: Negative for activity change, appetite change, fatigue and fever.  Respiratory: Negative for cough and shortness of breath.   Cardiovascular: Negative for chest pain.  Gastrointestinal: Negative for abdominal pain, diarrhea, nausea and vomiting.  Musculoskeletal: Positive for arthralgias. Negative for myalgias.  Skin: Positive for wound. Negative for color change.  Neurological: Negative for dizziness, weakness, light-headedness, numbness and headaches.     Physical Exam Triage Vital Signs ED Triage Vitals  Enc Vitals Group     BP 06/22/20 1726 123/79     Pulse Rate 06/22/20 1726 90     Resp 06/22/20 1726 18     Temp 06/22/20 1726 98.2 F (36.8 C)     Temp Source 06/22/20 1726 Oral     SpO2 06/22/20 1726 98 %  Weight --      Height --      Head Circumference --      Peak Flow --      Pain Score 06/22/20 1725 2     Pain Loc --      Pain Edu? --      Excl. in GC? --    No data found.  Updated Vital Signs BP 123/79 (BP Location: Left Arm)   Pulse 90   Temp 98.2 F (36.8 C) (Oral)   Resp 18   SpO2 98%   Visual Acuity Right Eye Distance:   Left Eye Distance:   Bilateral Distance:    Right Eye Near:   Left Eye Near:    Bilateral Near:     Physical Exam Vitals reviewed.  Constitutional:      General: He is awake.     Appearance: Normal appearance. He is normal weight. He is not ill-appearing.     Comments: Very pleasant male appears stated age in no acute distress  HENT:     Head: Normocephalic and atraumatic.  Cardiovascular:     Rate and Rhythm: Normal rate and  regular rhythm.     Pulses:          Radial pulses are 2+ on the right side and 2+ on the left side.     Heart sounds: No murmur heard.     Comments: Capillary refill within 2 seconds. Pulmonary:     Effort: Pulmonary effort is normal.     Breath sounds: Normal breath sounds. No stridor. No wheezing, rhonchi or rales.  Skin:    Findings: Laceration present.     Comments: Laceration noted across right middle DIP without active bleeding.  Neurological:     Mental Status: He is alert.  Psychiatric:        Behavior: Behavior is cooperative.      UC Treatments / Results  Labs (all labs ordered are listed, but only abnormal results are displayed) Labs Reviewed - No data to display  EKG   Radiology No results found.  Procedures Laceration Repair  Date/Time: 06/22/2020 7:41 PM Performed by: Jeani Hawking, PA-C Authorized by: Jeani Hawking, PA-C   Consent:    Consent obtained:  Verbal   Consent given by:  Patient   Risks discussed:  Infection, need for additional repair, pain, poor cosmetic result, poor wound healing and nerve damage   Alternatives discussed:  No treatment, delayed treatment and observation Universal protocol:    Procedure explained and questions answered to patient or proxy's satisfaction: yes     Relevant documents present and verified: yes     Test results available: yes     Imaging studies available: yes     Required blood products, implants, devices, and special equipment available: yes     Site/side marked: yes     Immediately prior to procedure, a time out was called: yes     Patient identity confirmed:  Verbally with patient Anesthesia:    Anesthesia method:  Nerve block   Block anesthetic:  Lidocaine 2% w/o epi   Block injection procedure:  Negative aspiration for blood, incremental injection and introduced needle   Block outcome:  Anesthesia achieved Laceration details:    Location:  Finger   Finger location:  R long finger   Length (cm):   4 Pre-procedure details:    Preparation:  Patient was prepped and draped in usual sterile fashion Exploration:    Hemostasis achieved with:  Direct pressure   Imaging outcome: foreign body not noted     Wound exploration: entire depth of wound visualized     Contaminated: no   Treatment:    Area cleansed with:  Povidone-iodine   Amount of cleaning:  Standard   Visualized foreign bodies/material removed: no     Debridement:  None Skin repair:    Repair method:  Sutures   Suture size:  5-0   Suture material:  Prolene   Suture technique:  Simple interrupted Approximation:    Approximation:  Close Repair type:    Repair type:  Simple Post-procedure details:    Dressing:  Splint for protection   Procedure completion:  Tolerated   (including critical care time)  Medications Ordered in UC Medications  Tdap (BOOSTRIX) injection 0.5 mL (0.5 mLs Intramuscular Given 06/22/20 1831)    Initial Impression / Assessment and Plan / UC Course  I have reviewed the triage vital signs and the nursing notes.  Pertinent labs & imaging results that were available during my care of the patient were reviewed by me and considered in my medical decision making (see chart for details).     Tetanus updated today.  Wound closed with 4 simple interrupted sutures (see procedure note above).  Patient had near syncopal episode with suturing which resolved with taking a break and providing cold water.  At the time of discharge he was feeling well and denied any lightheadedness or dizziness.  Vital signs were stable.  Patient was placed in finger splint given location of sutures to encourage immobilization and allow wound to heal.  Encouraged him to clean affected area with warm water and soap.  Discussed signs of infection that warrant reevaluation.  Strict return precautions given to which patient expressed understanding.  Final Clinical Impressions(s) / UC Diagnoses   Final diagnoses:  Laceration of right  middle finger without foreign body without damage to nail, initial encounter  Injury of finger of right hand, initial encounter     Discharge Instructions     Do not get area wet for 24 hours.  We updated your tetanus today.  He can take Tylenol and ibuprofen for pain relief.  Keep in splint to allow for healing.  If you have any signs of infection (redness, pain, discharge) you need to be reevaluated.  Please return in 7 to 10 days for suture removal.    ED Prescriptions    None     PDMP not reviewed this encounter.   Jeani Hawking, PA-C 06/22/20 1947

## 2020-06-22 NOTE — ED Triage Notes (Signed)
Pt presents with laceration in the right middle finger. Pt reports he cut the right middle finger with a metal door at work 1 hr ago. No active bleeding.

## 2021-04-09 ENCOUNTER — Telehealth: Payer: 59 | Admitting: Physician Assistant

## 2021-04-09 ENCOUNTER — Other Ambulatory Visit (HOSPITAL_COMMUNITY): Payer: Self-pay

## 2021-04-09 DIAGNOSIS — U071 COVID-19: Secondary | ICD-10-CM

## 2021-04-09 MED ORDER — BENZONATATE 100 MG PO CAPS
100.0000 mg | ORAL_CAPSULE | Freq: Three times a day (TID) | ORAL | 0 refills | Status: DC | PRN
  Filled 2021-04-09: qty 30, 10d supply, fill #0

## 2021-04-09 MED ORDER — FLUTICASONE PROPIONATE 50 MCG/ACT NA SUSP
2.0000 | Freq: Every day | NASAL | 0 refills | Status: DC
  Filled 2021-04-09: qty 16, 30d supply, fill #0

## 2021-04-09 NOTE — Progress Notes (Signed)
  E-Visit  for Positive Covid Test Result  We are sorry you are not feeling well. We are here to help!  You have tested positive for COVID-19, meaning that you were infected with the novel coronavirus and could give the virus to others.  It is vitally important that you stay home so you do not spread it to others.      Please continue isolation at home, for at least 10 days since the start of your symptoms and until you have had 24 hours with no fever (without taking a fever reducer) and with improving of symptoms.  If you have no symptoms but tested positive (or all symptoms resolve after 5 days and you have no fever) you can leave your house but continue to wear a mask around others for an additional 5 days. If you have a fever,continue to stay home until you have had 24 hours of no fever. Most cases improve 5-10 days from onset but we have seen a small number of patients who have gotten worse after the 10 days.  Please be sure to watch for worsening symptoms and remain taking the proper precautions.   Go to the nearest hospital ED for assessment if fever/cough/breathlessness are severe or illness seems like a threat to life.    The following symptoms may appear 2-14 days after exposure: Fever Cough Shortness of breath or difficulty breathing Chills Repeated shaking with chills Muscle pain Headache Sore throat New loss of taste or smell Fatigue Congestion or runny nose Nausea or vomiting Diarrhea  You have been enrolled in MyChart Home Monitoring for COVID-19. Daily you will receive a questionnaire within the MyChart website. Our COVID-19 response team will be monitoring your responses daily.  You can use medication such as prescription cough medication called Tessalon Perles 100 mg. You may take 1-2 capsules every 8 hours as needed for cough and prescription for Fluticasone nasal spray 2 sprays in each nostril one time per day  You may also take acetaminophen (Tylenol) as needed  for fever.  HOME CARE: Only take medications as instructed by your medical team. Drink plenty of fluids and get plenty of rest. A steam or ultrasonic humidifier can help if you have congestion.   GET HELP RIGHT AWAY IF YOU HAVE EMERGENCY WARNING SIGNS.  Call 911 or proceed to your closest emergency facility if: You develop worsening high fever. Trouble breathing Bluish lips or face Persistent pain or pressure in the chest New confusion Inability to wake or stay awake You cough up blood. Your symptoms become more severe Inability to hold down food or fluids  This list is not all possible symptoms. Contact your medical provider for any symptoms that are severe or concerning to you.    Your e-visit answers were reviewed by a board certified advanced clinical practitioner to complete your personal care plan.  Depending on the condition, your plan could have included both over the counter or prescription medications.  If there is a problem please reply once you have received a response from your provider.  Your safety is important to us.  If you have drug allergies check your prescription carefully.    You can use MyChart to ask questions about today's visit, request a non-urgent call back, or ask for a work or school excuse for 24 hours related to this e-Visit. If it has been greater than 24 hours you will need to follow up with your provider, or enter a new e-Visit to address those   concerns. You will get an e-mail in the next two days asking about your experience.  I hope that your e-visit has been valuable and will speed your recovery. Thank you for using e-visits.  I provided 5 minutes of non face-to-face time during this encounter for chart review and documentation.   

## 2021-08-11 ENCOUNTER — Ambulatory Visit (INDEPENDENT_AMBULATORY_CARE_PROVIDER_SITE_OTHER): Payer: No Typology Code available for payment source | Admitting: Family Medicine

## 2021-08-11 ENCOUNTER — Encounter (HOSPITAL_BASED_OUTPATIENT_CLINIC_OR_DEPARTMENT_OTHER): Payer: Self-pay | Admitting: Family Medicine

## 2021-08-11 DIAGNOSIS — Z0000 Encounter for general adult medical examination without abnormal findings: Secondary | ICD-10-CM

## 2021-08-11 NOTE — Assessment & Plan Note (Signed)
Routine HCM labs ordered. HCM reviewed/discussed. Anticipatory guidance regarding healthy weight, lifestyle and choices given. Recommend healthy diet.  Recommend approximately 150 minutes/week of moderate intensity exercise Recommend regular dental and vision exams Always use seatbelt/lap and shoulder restraints Recommend using smoke alarms and checking batteries at least twice a year Recommend using sunscreen when outside Patient is UTD on tetanus

## 2021-08-11 NOTE — Progress Notes (Signed)
New Patient Office Visit  Subjective    Patient ID: Joseph Mcguire, male    DOB: 1985-09-23  Age: 36 y.o. MRN: [id number]  CC:  Chief Complaint  Patient presents with   New Patient (Initial Visit)    Pt here to establish new care    HPI Joseph Mcguire presents to establish care Last PCP - Dr. Jeanice Lim, last saw about 2 years ago  Denies any significant chronic medical issues. Takes Zyrtec for season allergies Had a testicular biopsy many years ago - this was related to infertility evaluation/IVF treatment Last dental visit was about 2 weeks ago  Patient is originally from [place]. 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 Outside of work, patient used to lift weight before pandemic. Has daughter at home so spends lots of time with her.  Outpatient Encounter Medications as of 08/11/2021  Medication Sig   loratadine (CLARITIN) 10 MG tablet Take 10 mg by mouth daily.   [DISCONTINUED] benzonatate (TESSALON) 100 MG capsule Take 1 capsule (100 mg total) by mouth 3 (three) times daily as needed. (Patient not taking: Reported on 08/11/2021)   [DISCONTINUED] fluticasone (FLONASE) 50 MCG/ACT nasal spray Place 2 sprays into both nostrils daily. (Patient not taking: Reported on 08/11/2021)   [DISCONTINUED] Multiple Vitamin (MULTIVITAMIN WITH MINERALS) TABS tablet Take 1 tablet by mouth daily. (Patient not taking: Reported on 08/11/2021)   No facility-administered encounter medications on file as of 08/11/2021.    Past Medical History:  Diagnosis Date   Allergy    seasonal   Asthma    childhood   Infertile male syndrome     Past Surgical History:  Procedure Laterality Date   testicular graft      Family History  Problem Relation Age of Onset   Depression Mother    Arthritis Father    Hyperlipidemia Father    Hypertension Father    Birth defects Brother    Early death Brother 72       aspiration   Mental retardation Brother    Learning disabilities  Brother    Stroke Paternal Uncle    Arthritis Paternal Grandmother    Cancer Paternal Grandfather        lung, bone   COPD Paternal Grandfather     Social History   Socioeconomic History   Marital status: Married    Spouse name: Not on file   Number of children: Not on file   Years of education: Not on file   Highest education level: Not on file  Occupational History   Not on file  Tobacco Use   Smoking status: Never   Smokeless tobacco: Never  Vaping Use   Vaping Use: Never used  Substance and Sexual Activity   Alcohol use: Yes    Comment: social   Drug use: No   Sexual activity: Yes  Other Topics Concern   Not on file  Social History Narrative   Not on file   Social Determinants of Health   Financial Resource Strain: Not on file  Food Insecurity: Not on file  Transportation Needs: Not on file  Physical Activity: Not on file  Stress: Not on file  Social Connections: Not on file  Intimate Partner Violence: Not on file    Objective    BP 118/78   Pulse 78   Ht 6' (1.829 m)   Wt 224 lb 4.8 oz (101.7 kg)   SpO2 100%   BMI 30.42 kg/m   Physical  Exam Constitutional:      General: He is not in acute distress.    Appearance: He is normal weight.  HENT:     Head: Normocephalic and atraumatic.     Right Ear: Tympanic membrane, ear canal and external ear normal.     Left Ear: Tympanic membrane, ear canal and external ear normal.     Nose: Nose normal.     Mouth/Throat:     Mouth: Mucous membranes are moist.     Pharynx: Oropharynx is clear.  Eyes:     Extraocular Movements: Extraocular movements intact.     Conjunctiva/sclera: Conjunctivae normal.     Pupils: Pupils are equal, round, and reactive to light.  Cardiovascular:     Rate and Rhythm: Normal rate and regular rhythm.     Pulses: Normal pulses.     Heart sounds: Normal heart sounds. No murmur heard. Pulmonary:     Effort: Pulmonary effort is normal.     Breath sounds: Normal breath sounds. No  wheezing.  Abdominal:     General: Bowel sounds are normal. There is no distension.     Palpations: Abdomen is soft.     Tenderness: There is no abdominal tenderness. There is no guarding.  Musculoskeletal:     Cervical back: Normal range of motion. No tenderness.  Skin:    General: Skin is warm.     Capillary Refill: Capillary refill takes less than 2 seconds.     Coloration: Skin is not jaundiced.     Findings: No rash.  Neurological:     General: No focal deficit present.     Mental Status: He is alert and oriented to person, place, and time.     Gait: Gait normal.  Psychiatric:        Mood and Affect: Mood normal.        Behavior: Behavior normal.     Assessment & Plan:   Problem List Items Addressed This Visit       Other   Wellness examination    Routine HCM labs ordered. HCM reviewed/discussed. Anticipatory guidance regarding healthy weight, lifestyle and choices given. Recommend healthy diet.  Recommend approximately 150 minutes/week of moderate intensity exercise Recommend regular dental and vision exams Always use seatbelt/lap and shoulder restraints Recommend using smoke alarms and checking batteries at least twice a year Recommend using sunscreen when outside Patient is UTD on tetanus      Relevant Orders   CBC with Differential/Platelet   Comprehensive metabolic panel   Lipid panel   TSH Rfx on Abnormal to Free T4    Return in about 1 year (around 08/12/2022) for CPE.   Ric Rosenberg J De [place], MD

## 2021-08-16 ENCOUNTER — Ambulatory Visit (HOSPITAL_BASED_OUTPATIENT_CLINIC_OR_DEPARTMENT_OTHER): Payer: No Typology Code available for payment source

## 2021-08-17 ENCOUNTER — Ambulatory Visit (HOSPITAL_BASED_OUTPATIENT_CLINIC_OR_DEPARTMENT_OTHER): Payer: No Typology Code available for payment source

## 2021-08-18 LAB — CBC WITH DIFFERENTIAL/PLATELET
Basophils Absolute: 0.1 10*3/uL (ref 0.0–0.2)
Basos: 1 %
EOS (ABSOLUTE): 0.2 10*3/uL (ref 0.0–0.4)
Eos: 2 %
Hematocrit: 42.3 % (ref 37.5–51.0)
Hemoglobin: 14.2 g/dL (ref 13.0–17.7)
Immature Grans (Abs): 0 10*3/uL (ref 0.0–0.1)
Immature Granulocytes: 0 %
Lymphocytes Absolute: 3.1 10*3/uL (ref 0.7–3.1)
Lymphs: 34 %
MCH: 28.9 pg (ref 26.6–33.0)
MCHC: 33.6 g/dL (ref 31.5–35.7)
MCV: 86 fL (ref 79–97)
Monocytes Absolute: 0.7 10*3/uL (ref 0.1–0.9)
Monocytes: 8 %
Neutrophils Absolute: 5.1 10*3/uL (ref 1.4–7.0)
Neutrophils: 55 %
Platelets: 190 10*3/uL (ref 150–450)
RBC: 4.92 x10E6/uL (ref 4.14–5.80)
RDW: 12.4 % (ref 11.6–15.4)
WBC: 9.2 10*3/uL (ref 3.4–10.8)

## 2021-08-18 LAB — COMPREHENSIVE METABOLIC PANEL
ALT: 18 IU/L (ref 0–44)
AST: 19 IU/L (ref 0–40)
Albumin/Globulin Ratio: 2.1 (ref 1.2–2.2)
Albumin: 4.8 g/dL (ref 4.0–5.0)
Alkaline Phosphatase: 52 IU/L (ref 44–121)
BUN/Creatinine Ratio: 11 (ref 9–20)
BUN: 11 mg/dL (ref 6–20)
Bilirubin Total: 0.4 mg/dL (ref 0.0–1.2)
CO2: 20 mmol/L (ref 20–29)
Calcium: 10 mg/dL (ref 8.7–10.2)
Chloride: 103 mmol/L (ref 96–106)
Creatinine, Ser: 0.97 mg/dL (ref 0.76–1.27)
Globulin, Total: 2.3 g/dL (ref 1.5–4.5)
Glucose: 91 mg/dL (ref 70–99)
Potassium: 4.2 mmol/L (ref 3.5–5.2)
Sodium: 140 mmol/L (ref 134–144)
Total Protein: 7.1 g/dL (ref 6.0–8.5)
eGFR: 103 mL/min/{1.73_m2} (ref >59)

## 2021-08-18 LAB — TSH RFX ON ABNORMAL TO FREE T4: TSH: 1.88 u[IU]/mL (ref 0.450–4.500)

## 2021-08-18 LAB — LIPID PANEL
Chol/HDL Ratio: 3.6 ratio (ref 0.0–5.0)
Cholesterol, Total: 204 mg/dL — ABNORMAL HIGH (ref 100–199)
HDL: 57 mg/dL (ref >39)
LDL Chol Calc (NIH): 132 mg/dL — ABNORMAL HIGH (ref 0–99)
Triglycerides: 84 mg/dL (ref 0–149)
VLDL Cholesterol Cal: 15 mg/dL (ref 5–40)

## 2022-05-10 ENCOUNTER — Emergency Department (HOSPITAL_COMMUNITY)
Admission: EM | Admit: 2022-05-10 | Discharge: 2022-05-10 | Disposition: A | Discharge status: Home / Self Care | Payer: PRIVATE HEALTH INSURANCE | Attending: Emergency Medicine | Admitting: Emergency Medicine

## 2022-05-10 ENCOUNTER — Encounter (HOSPITAL_COMMUNITY): Payer: Self-pay

## 2022-05-10 ENCOUNTER — Other Ambulatory Visit (HOSPITAL_COMMUNITY): Payer: Self-pay

## 2022-05-10 ENCOUNTER — Other Ambulatory Visit: Payer: Self-pay

## 2022-05-10 ENCOUNTER — Emergency Department (HOSPITAL_COMMUNITY): Payer: 59

## 2022-05-10 DIAGNOSIS — W232XXA Caught, crushed, jammed or pinched between a moving and stationary object, initial encounter: Secondary | ICD-10-CM | POA: Diagnosis not present

## 2022-05-10 DIAGNOSIS — S61311A Laceration without foreign body of left index finger with damage to nail, initial encounter: Secondary | ICD-10-CM | POA: Diagnosis not present

## 2022-05-10 DIAGNOSIS — Y990 Civilian activity done for income or pay: Secondary | ICD-10-CM | POA: Diagnosis not present

## 2022-05-10 DIAGNOSIS — S62631A Displaced fracture of distal phalanx of left index finger, initial encounter for closed fracture: Secondary | ICD-10-CM | POA: Diagnosis not present

## 2022-05-10 DIAGNOSIS — S6992XA Unspecified injury of left wrist, hand and finger(s), initial encounter: Secondary | ICD-10-CM | POA: Diagnosis present

## 2022-05-10 DIAGNOSIS — S62639A Displaced fracture of distal phalanx of unspecified finger, initial encounter for closed fracture: Secondary | ICD-10-CM

## 2022-05-10 MED ORDER — CEPHALEXIN 500 MG PO CAPS
500.0000 mg | ORAL_CAPSULE | Freq: Four times a day (QID) | ORAL | 0 refills | Status: DC
  Filled 2022-05-10: qty 20, 5d supply, fill #0

## 2022-05-10 MED ORDER — LIDOCAINE-EPINEPHRINE (PF) 2 %-1:200000 IJ SOLN
10.0000 mL | Freq: Once | INTRAMUSCULAR | Status: AC
  Administered 2022-05-10: 10 mL
  Filled 2022-05-10: qty 20

## 2022-05-10 NOTE — ED Triage Notes (Signed)
C/o shutting left pointer finger in door at work PTA and laceration near nail bed Bleeding controlled.  Denies blood thinner usage Tetanus UTD.

## 2022-05-10 NOTE — Discharge Instructions (Signed)
Thank you for allowing me to be part of your care today.  Your fingernail was removed and some dissolving sutures were used to repair around the nailbed.  I recommend following up with your primary care provider within a week to reassess the injury and monitor for healing.   I am sending you home on 5 days of antibiotics to prevent infection.  Please take this for the entire course.  Monitor for signs of infection including redness, increased warmth, pain, swelling, and drainage from the site.  Return to ED if you have worsening of your symptoms or have any new concerns.   Please keep your fingers buddy taped together to help give support as you do have a small fracture at the very tip of your finger.    You may switch to a regular bandage with antibiotic ointment in the next couple of days.

## 2022-05-10 NOTE — ED Provider Notes (Signed)
[facility] Provider Note   CSN: [id number] Arrival date & time: 05/10/22  1011     History  Chief Complaint  Patient presents with   Laceration    Joseph Mcguire is a 37 y.o. male presents to the ED after shutting his left index finger in a door at work.  He reports he didn't move his hand in time causing the injury.  He states he has laceration near the nail bed and his fingernail is broken and partially lifted up.  He was able to control bleeding prior to arrival.  He does not take blood thinners and he states his tetanus is UTD.  Denies numbness, weakness, or decreased range of motion in his digits.         Home Medications Prior to Admission medications   Medication Sig Start Date End Date Taking? Authorizing Provider  cephALEXin (KEFLEX) 500 MG capsule Take 1 capsule (500 mg total) by mouth 4 (four) times daily. 05/10/22  Yes Ilda Laskin R, PA  loratadine (CLARITIN) 10 MG tablet Take 10 mg by mouth daily.    [provider]      Allergies    Patient has no known allergies.    Review of Systems   Review of Systems  Musculoskeletal:  Positive for arthralgias (left index finger pain). Negative for joint swelling.  Skin:  Positive for wound (left index and middle finger).  Neurological:  Negative for weakness and numbness.    Physical Exam Updated Vital Signs BP (!) 154/87 (BP Location: Right Arm)   Pulse 90   Temp 98 F (36.7 C) (Oral)   Resp 16   SpO2 100%  Physical Exam Vitals and nursing note reviewed.  Constitutional:      General: He is not in acute distress.    Appearance: Normal appearance. He is not ill-appearing or diaphoretic.  Cardiovascular:     Rate and Rhythm: Normal rate and regular rhythm.  Pulmonary:     Effort: Pulmonary effort is normal.  Musculoskeletal:     Right hand: Normal.     Left hand: Swelling, laceration and tenderness present. No deformity. Normal strength. Normal  sensation. Normal capillary refill. Normal pulse.     Comments: Injury to the left index finger with disrupted nail, partially broken and lifted on the proximal side.  Small laceration to the medial aspect of nail.  Minimal bleeding.  Partial avulsion of skin on dorsal side of left middle finger at the DIP joint.  No swelling or active bleeding.  PIP and DIP joints of both index and middle finger with normal ROM.    Neurological:     Mental Status: He is alert. Mental status is at baseline.  Psychiatric:        Mood and Affect: Mood normal.        Behavior: Behavior normal.     ED Results / Procedures / Treatments   Labs (all labs ordered are listed, but only abnormal results are displayed) Labs Reviewed - No data to display  EKG None  Radiology DG Finger Index Left  Result Date: 05/10/2022 CLINICAL DATA:  Pain after trauma EXAM: LEFT INDEX FINGER 3V COMPARISON:  Left hand x-ray 07/08/2014 FINDINGS: Overlapping bandage obscures the distal aspect of second finger but there is a nondisplaced subtle fracture of the distal tuft of the distal phalanx of the second digit. No additional fracture or dislocation. Preserved joint spaces and bone mineralization. IMPRESSION: Nondisplaced fracture of the  distal tuft of the distal phalanx of the second digit. Overlapping bandages Electronically Signed   By: Jill Side M.D.   On: 05/10/2022 12:32    Procedures Procedures    Medications Ordered in ED Medications  lidocaine-EPINEPHrine (XYLOCAINE W/EPI) 2 %-1:200000 (PF) injection 10 mL (10 mLs Infiltration Given by Other 05/10/22 1417)    ED Course/ Medical Decision Making/ A&P                             Medical Decision Making Amount and/or Complexity of Data Reviewed Radiology: ordered.  Risk Prescription drug management.   Patient presents to the ED with injury to the left index and middle finger while at work.  Patient reports he accidentally had a door shut on his finger causing  laceration and damage to the left index finger nail bed and a partial avulsion to his left middle finger.  He reports his tetanus is UTD.    Exam significant for injury to the left index finger with disrupted nail, partially broken and lifted on the proximal side.  Small laceration to the medial aspect of nail.  Minimal bleeding.  Partial avulsion of skin on dorsal side of left middle finger at the DIP joint.  No swelling or active bleeding.  PIP and DIP joints of both index and middle finger with normal ROM.  All digits are neurovascularly intact.    I ordered and personally interpreted x-ray of left index finger which showed a non-displaced fracture of the distal tuft of the distal phalanx.    Patient's nail was removed and laceration medially was repaired with sutures.  See procedure note for more detail.  Patient tolerated well.  Absorbable sutures were used and he will not need to return for suture removal.  There was damage to soft tissue of nail bed with some of the tissue avulsed.  No direct laceration in the nail bed.  Patient does have a small fracture on the distal tuft.  Index finger will be buddy taped for stability.  Will have patient follow-up with his PCP.  Patient will be sent home on 5 days of cephalexin for prophylaxis given crush injury with damage to nail bed.           Final Clinical Impression(s) / ED Diagnoses Final diagnoses:  Laceration of left index finger without foreign body with damage to nail, initial encounter  Closed fracture of tuft of distal phalanx of finger    Rx / DC Orders ED Discharge Orders          Ordered    cephALEXin (KEFLEX) 500 MG capsule  4 times daily        05/10/22 1418              Theressa Stamps R, [place] 05/10/22 1419    Kemper Durie, DO 05/10/22 1631

## 2022-08-15 ENCOUNTER — Encounter (HOSPITAL_BASED_OUTPATIENT_CLINIC_OR_DEPARTMENT_OTHER): Payer: Self-pay | Admitting: Family Medicine

## 2022-08-16 ENCOUNTER — Encounter (HOSPITAL_BASED_OUTPATIENT_CLINIC_OR_DEPARTMENT_OTHER): Payer: 59 | Admitting: Family Medicine

## 2022-09-05 ENCOUNTER — Ambulatory Visit (INDEPENDENT_AMBULATORY_CARE_PROVIDER_SITE_OTHER): Payer: 59 | Admitting: Family Medicine

## 2022-09-05 ENCOUNTER — Encounter (HOSPITAL_BASED_OUTPATIENT_CLINIC_OR_DEPARTMENT_OTHER): Payer: Self-pay | Admitting: Family Medicine

## 2022-09-05 VITALS — BP 134/88 | HR 90 | Temp 97.7°F | Ht 72.0 in | Wt 220.0 lb

## 2022-09-05 DIAGNOSIS — Z0000 Encounter for general adult medical examination without abnormal findings: Secondary | ICD-10-CM | POA: Diagnosis not present

## 2022-09-05 DIAGNOSIS — Z600 Problems of adjustment to life-cycle transitions: Secondary | ICD-10-CM

## 2022-09-05 NOTE — Assessment & Plan Note (Signed)
Patient reports occasional feelings of anxiousness/irritability.  Feels that this has been more common with having a child at home.  Thinks that episodes occur on a weekly basis.  Generally does not feel that they are overly impactful to his day-to-day life.  Denies any issues in the past with anxiety or depression. We discussed today pertaining to symptoms and management considerations.  We discussed medication options, primarily related to daily medications.  We also discussed nonmedication options pertaining to counseling/therapy.  Patient may have some interest in counseling/therapy. Feeling about options moving forward and if he does want to proceed with counseling, advised on patient sending MyChart message or calling and we can place referral

## 2022-09-05 NOTE — Assessment & Plan Note (Signed)
Routine HCM labs ordered. HCM reviewed/discussed. Anticipatory guidance regarding healthy weight, lifestyle and choices given. Recommend healthy diet.  Recommend approximately 150 minutes/week of moderate intensity exercise Recommend regular dental and vision exams Always use seatbelt/lap and shoulder restraints Recommend using smoke alarms and checking batteries at least twice a year Recommend using sunscreen when outside Patient is UTD on tetanus

## 2022-09-05 NOTE — Progress Notes (Signed)
Subjective:    CC: Annual Physical Exam  HPI:  Joseph Mcguire is a 37 y.o. presenting for annual physical  I reviewed the past medical history, family history, social history, surgical history, and allergies today and no changes were needed.  Please see the problem list section below in epic for further details.  Past Medical History: Past Medical History:  Diagnosis Date   Allergy    seasonal   Asthma    childhood   Infertile male syndrome    Past Surgical History: Past Surgical History:  Procedure Laterality Date   testicular graft     Social History: Social History   Socioeconomic History   Marital status: Married    Spouse name: Not on file   Number of children: Not on file   Years of education: Not on file   Highest education level: Not on file  Occupational History   Not on file  Tobacco Use   Smoking status: Never   Smokeless tobacco: Never  Vaping Use   Vaping status: Never Used  Substance and Sexual Activity   Alcohol use: Yes    Comment: social   Drug use: No   Sexual activity: Yes  Other Topics Concern   Not on file  Social History Narrative   Not on file   Social Determinants of Health   Financial Resource Strain: Not on file  Food Insecurity: Not on file  Transportation Needs: Not on file  Physical Activity: Not on file  Stress: Not on file  Social Connections: Not on file   Family History: Family History  Problem Relation Age of Onset   Depression Mother    Arthritis Father    Hyperlipidemia Father    Hypertension Father    Birth defects Brother    Early death Brother 38       aspiration   Mental retardation Brother    Learning disabilities Brother    Stroke Paternal Uncle    Arthritis Paternal Grandmother    Cancer Paternal Grandfather        lung, bone   COPD Paternal Grandfather    Allergies: No Known Allergies Medications: See med rec.  Review of Systems: No headache, visual changes, nausea, vomiting, diarrhea,  constipation, dizziness, abdominal pain, skin rash, fevers, chills, night sweats, swollen lymph nodes, weight loss, chest pain, body aches, joint swelling, muscle aches, shortness of breath, mood changes, visual or auditory hallucinations.  Objective:    BP 134/88 (BP Location: Left Arm, Patient Position: Sitting, Cuff Size: Normal)   Pulse 90   Temp 97.7 F (36.5 C)   Ht 6' (1.829 m)   Wt 220 lb (99.8 kg)   SpO2 100%   BMI 29.84 kg/m   General: Well Developed, well nourished, and in no acute distress. Neuro: Alert and oriented x3, extra-ocular muscles intact, sensation grossly intact. Cranial nerves II through XII are intact, motor, sensory, and coordinative functions are all intact. HEENT: Normocephalic, atraumatic, pupils equal round reactive to light, neck supple, no masses, no lymphadenopathy, thyroid nonpalpable. Oropharynx, nasopharynx, external ear canals are unremarkable. Skin: Warm and dry, no rashes noted. Cardiac: Regular rate and rhythm, no murmurs rubs or gallops. Respiratory: Clear to auscultation bilaterally. Not using accessory muscles, speaking in full sentences. Abdominal: Soft, nontender, nondistended, positive bowel sounds, no masses, no organomegaly. Musculoskeletal: Shoulder, elbow, wrist, hip, knee, ankle stable, and with full range of motion.  Impression and Recommendations:    Wellness examination Assessment & Plan: Routine HCM labs ordered. HCM  reviewed/discussed. Anticipatory guidance regarding healthy weight, lifestyle and choices given. Recommend healthy diet.  Recommend approximately 150 minutes/week of moderate intensity exercise Recommend regular dental and vision exams Always use seatbelt/lap and shoulder restraints Recommend using smoke alarms and checking batteries at least twice a year Recommend using sunscreen when outside Patient is UTD on tetanus  Orders: -     CBC with Differential/Platelet -     Comprehensive metabolic panel -     Lipid  panel -     TSH Rfx on Abnormal to Free T4  Phase of life problem in adult Assessment & Plan: Patient reports occasional feelings of anxiousness/irritability.  Feels that this has been more common with having a child at home.  Thinks that episodes occur on a weekly basis.  Generally does not feel that they are overly impactful to his day-to-day life.  Denies any issues in the past with anxiety or depression. We discussed today pertaining to symptoms and management considerations.  We discussed medication options, primarily related to daily medications.  We also discussed nonmedication options pertaining to counseling/therapy.  Patient may have some interest in counseling/therapy. Feeling about options moving forward and if he does want to proceed with counseling, advised on patient sending MyChart message or calling and we can place referral   Return in about 1 year (around 09/05/2023) for CPE.   ___________________________________________ Lathon Adan de [place], MD, ABFM, CAQSM Primary Care and Sports Medicine [facility]

## 2022-09-05 NOTE — Patient Instructions (Signed)
.  av

## 2022-09-06 LAB — CBC WITH DIFFERENTIAL/PLATELET
Basophils Absolute: 0.1 10*3/uL (ref 0.0–0.2)
Basos: 2 %
EOS (ABSOLUTE): 0.2 10*3/uL (ref 0.0–0.4)
Eos: 2 %
Hematocrit: 44.2 % (ref 37.5–51.0)
Hemoglobin: 14.8 g/dL (ref 13.0–17.7)
Immature Grans (Abs): 0 10*3/uL (ref 0.0–0.1)
Immature Granulocytes: 0 %
Lymphocytes Absolute: 2.2 10*3/uL (ref 0.7–3.1)
Lymphs: 32 %
MCH: 29.5 pg (ref 26.6–33.0)
MCHC: 33.5 g/dL (ref 31.5–35.7)
MCV: 88 fL (ref 79–97)
Monocytes Absolute: 0.6 10*3/uL (ref 0.1–0.9)
Monocytes: 9 %
Neutrophils Absolute: 3.8 10*3/uL (ref 1.4–7.0)
Neutrophils: 55 %
Platelets: 202 10*3/uL (ref 150–450)
RBC: 5.02 x10E6/uL (ref 4.14–5.80)
RDW: 12.3 % (ref 11.6–15.4)
WBC: 6.9 10*3/uL (ref 3.4–10.8)

## 2022-09-06 LAB — COMPREHENSIVE METABOLIC PANEL
ALT: 21 IU/L (ref 0–44)
AST: 24 IU/L (ref 0–40)
Albumin: 4.9 g/dL (ref 4.1–5.1)
Alkaline Phosphatase: 68 IU/L (ref 44–121)
BUN/Creatinine Ratio: 14 (ref 9–20)
BUN: 14 mg/dL (ref 6–20)
Bilirubin Total: 0.5 mg/dL (ref 0.0–1.2)
CO2: 21 mmol/L (ref 20–29)
Calcium: 9.9 mg/dL (ref 8.7–10.2)
Chloride: 104 mmol/L (ref 96–106)
Creatinine, Ser: 1 mg/dL (ref 0.76–1.27)
Globulin, Total: 2.5 g/dL (ref 1.5–4.5)
Glucose: 96 mg/dL (ref 70–99)
Potassium: 4.3 mmol/L (ref 3.5–5.2)
Sodium: 142 mmol/L (ref 134–144)
Total Protein: 7.4 g/dL (ref 6.0–8.5)
eGFR: 99 mL/min/{1.73_m2} (ref >59)

## 2022-09-06 LAB — LIPID PANEL
Chol/HDL Ratio: 4 ratio (ref 0.0–5.0)
Cholesterol, Total: 234 mg/dL — ABNORMAL HIGH (ref 100–199)
HDL: 59 mg/dL (ref >39)
LDL Chol Calc (NIH): 158 mg/dL — ABNORMAL HIGH (ref 0–99)
Triglycerides: 99 mg/dL (ref 0–149)
VLDL Cholesterol Cal: 17 mg/dL (ref 5–40)

## 2022-09-06 LAB — TSH RFX ON ABNORMAL TO FREE T4: TSH: 1.75 u[IU]/mL (ref 0.450–4.500)
